# Patient Record
Sex: Male | Born: 1975 | Smoking: Never smoker
Health system: Southern US, Community
[De-identification: ages and names within clinical notes are randomized; demographics above are authoritative.]

---

## 2010-10-13 ENCOUNTER — Other Ambulatory Visit: Payer: Self-pay | Admitting: Family Medicine

## 2010-10-13 DIAGNOSIS — R1011 Right upper quadrant pain: Secondary | ICD-10-CM

## 2010-10-21 ENCOUNTER — Ambulatory Visit
Admission: RE | Admit: 2010-10-21 | Discharge: 2010-10-21 | Disposition: A | Discharge status: Home / Self Care | Payer: Managed Care, Other (non HMO) | Source: Ambulatory Visit | Attending: Family Medicine | Admitting: Family Medicine

## 2010-10-21 DIAGNOSIS — R1011 Right upper quadrant pain: Secondary | ICD-10-CM

## 2012-01-26 ENCOUNTER — Inpatient Hospital Stay: Admit: 2012-01-26 | Payer: Self-pay | Admitting: Obstetrics and Gynecology

## 2012-01-26 SURGERY — DILATION AND EVACUATION, UTERUS
Anesthesia: Choice | Site: Vagina

## 2014-11-13 ENCOUNTER — Emergency Department (HOSPITAL_COMMUNITY)
Admission: EM | Admit: 2014-11-13 | Discharge: 2014-11-13 | Disposition: A | Discharge status: Home / Self Care | Payer: Managed Care, Other (non HMO) | Attending: Emergency Medicine | Admitting: Emergency Medicine

## 2014-11-13 ENCOUNTER — Emergency Department (HOSPITAL_COMMUNITY): Payer: Managed Care, Other (non HMO)

## 2014-11-13 ENCOUNTER — Encounter (HOSPITAL_COMMUNITY): Payer: Self-pay | Admitting: Emergency Medicine

## 2014-11-13 DIAGNOSIS — S0992XA Unspecified injury of nose, initial encounter: Secondary | ICD-10-CM | POA: Insufficient documentation

## 2014-11-13 DIAGNOSIS — S0512XA Contusion of eyeball and orbital tissues, left eye, initial encounter: Secondary | ICD-10-CM | POA: Insufficient documentation

## 2014-11-13 DIAGNOSIS — S0285XA Fracture of orbit, unspecified, initial encounter for closed fracture: Secondary | ICD-10-CM

## 2014-11-13 DIAGNOSIS — Y998 Other external cause status: Secondary | ICD-10-CM | POA: Insufficient documentation

## 2014-11-13 DIAGNOSIS — Y9355 Activity, bike riding: Secondary | ICD-10-CM | POA: Insufficient documentation

## 2014-11-13 DIAGNOSIS — S0093XA Contusion of unspecified part of head, initial encounter: Secondary | ICD-10-CM | POA: Insufficient documentation

## 2014-11-13 DIAGNOSIS — Y9289 Other specified places as the place of occurrence of the external cause: Secondary | ICD-10-CM | POA: Insufficient documentation

## 2014-11-13 MED ORDER — TETRACAINE HCL 0.5 % OP SOLN
1.0000 [drp] | Freq: Once | OPHTHALMIC | Status: AC
  Administered 2014-11-13: 1 [drp] via OPHTHALMIC
  Filled 2014-11-13: qty 2

## 2014-11-13 MED ORDER — FLUORESCEIN SODIUM 1 MG OP STRP
1.0000 | ORAL_STRIP | Freq: Once | OPHTHALMIC | Status: AC
  Administered 2014-11-13: 1 via OPHTHALMIC
  Filled 2014-11-13: qty 1

## 2014-11-13 MED ORDER — HYDROCODONE-ACETAMINOPHEN 5-325 MG PO TABS: 1.0000 | ORAL_TABLET | ORAL | Status: AC | PRN

## 2014-11-13 MED ORDER — IBUPROFEN 800 MG PO TABS
800.0000 mg | ORAL_TABLET | Freq: Once | ORAL | Status: AC
  Administered 2014-11-13: 800 mg via ORAL
  Filled 2014-11-13: qty 1
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
## 2014-11-13 NOTE — Discharge Instructions (Signed)
Take the prescribed medication as directed for pain. Follow-up with Dr. Randon Goldsmith regarding orbital fracture to ensure it is healing properly and there are no changes in your vision. Return to the ED for new or worsening symptoms.

## 2014-11-13 NOTE — Progress Notes (Signed)
Patient listed as having  [employer] without a pcp.  EDCM spoke to patient and his daughter at bedside.  Patient reports his pcp is is located at the [facility].  Patient reports he has not been to see them in two years.  [facility] encouraged patient to make an appointment for follow up, discussed importance of follow up care with pcp.  Patient and patient's daughter voiced understanding.  No further EDCM needs at this time.

## 2018-09-25 ENCOUNTER — Encounter (HOSPITAL_COMMUNITY): Payer: Self-pay | Admitting: *Deleted

## 2018-09-25 ENCOUNTER — Emergency Department (HOSPITAL_COMMUNITY)
Admission: EM | Admit: 2018-09-25 | Discharge: 2018-09-25 | Disposition: A | Discharge status: Home / Self Care | Payer: Managed Care, Other (non HMO) | Attending: Emergency Medicine | Admitting: Emergency Medicine

## 2018-09-25 ENCOUNTER — Other Ambulatory Visit: Payer: Self-pay

## 2018-09-25 DIAGNOSIS — Y92018 Other place in single-family (private) house as the place of occurrence of the external cause: Secondary | ICD-10-CM | POA: Insufficient documentation

## 2018-09-25 DIAGNOSIS — Z23 Encounter for immunization: Secondary | ICD-10-CM | POA: Insufficient documentation

## 2018-09-25 DIAGNOSIS — Y9389 Activity, other specified: Secondary | ICD-10-CM | POA: Insufficient documentation

## 2018-09-25 DIAGNOSIS — S61211A Laceration without foreign body of left index finger without damage to nail, initial encounter: Secondary | ICD-10-CM

## 2018-09-25 DIAGNOSIS — Y999 Unspecified external cause status: Secondary | ICD-10-CM | POA: Insufficient documentation

## 2018-09-25 DIAGNOSIS — W268XXA Contact with other sharp object(s), not elsewhere classified, initial encounter: Secondary | ICD-10-CM | POA: Insufficient documentation

## 2018-09-25 MED ORDER — TETANUS-DIPHTH-ACELL PERTUSSIS 5-2.5-18.5 LF-MCG/0.5 IM SUSP
0.5000 mL | Freq: Once | INTRAMUSCULAR | Status: AC
  Administered 2018-09-25: 0.5 mL via INTRAMUSCULAR
  Filled 2018-09-25: qty 0.5

## 2018-09-25 NOTE — ED Triage Notes (Signed)
Pt states he was fixing his car when a steel piece cut his left index finger.  Pt states he cleaned the laceration with alcohol at home.  Bleeding controlled at this time.  Pt states his tetanus is out of date.

## 2018-09-25 NOTE — Discharge Instructions (Signed)
Please follow up with your primary care provider within 5-7 days for re-evaluation of your symptoms. If you do not have a primary care provider, information for a healthcare clinic has been provided for you to make arrangements for follow up care.  Please return to the emergency room immediately if you experience any new or worsening symptoms or any symptoms that indicate worsening infection such as fevers, increased redness/swelling/pain, warmth, or drainage from the affected area.    

## 2018-09-25 NOTE — ED Provider Notes (Signed)
[place] DEPT Provider Note   CSN: [id number] Arrival date & time: 09/25/18  1211    History   Chief Complaint Chief Complaint  Patient presents with  . Laceration   Offered translator to patient multiple times and he declined.  HPI Douglas Kaiser is a 43 y.o. male.     HPI   Patient is a 43 year old male who presents the emergency department today for evaluation of left index finger laceration.  States he was working on a car yesterday when he cut his finger on a piece of metal.  He washed out with alcohol yesterday.  Denies any other injuries.  No fevers, redness or drainage of pus.  No numbness to the finger.  No changes in range of motion.  He is not sure when his last tetanus shot was.  History reviewed. No pertinent past medical history.  There are no active problems to display for this patient.   History reviewed. No pertinent surgical history.      Home Medications    Prior to Admission medications   Medication Sig Start Date End Date Taking? Authorizing Provider  HYDROcodone-acetaminophen (NORCO/VICODIN) 5-325 MG per tablet Take 1 tablet by mouth every 4 (four) hours as needed. 11/13/14   Larene Pickett, PA-C    Family History No family history on file.  Social History Social History   Tobacco Use  . Smoking status: Never Smoker  . Smokeless tobacco: Never Used  Substance Use Topics  . Alcohol use: No  . Drug use: Never     Allergies   Patient has no known allergies.   Review of Systems Review of Systems  Constitutional: Negative for fever.  Musculoskeletal:       Finger pain  Skin: Positive for wound.     Physical Exam Updated Vital Signs BP (!) 127/93 (BP Location: Right Arm)   Pulse 69   Temp 98.5 F (36.9 C) (Oral)   Resp 16   SpO2 100%   Physical Exam Constitutional:      General: He is not in acute distress.    Appearance: He is well-developed.  Eyes:     Conjunctiva/sclera: Conjunctivae  normal.  Cardiovascular:     Rate and Rhythm: Normal rate.  Pulmonary:     Effort: Pulmonary effort is normal.  Musculoskeletal:     Comments: 1 cm superficial laceration over the dorsum of the left index finger at the level of the DIP joint.  Patient has full range of motion at the DIP, PIP and MCP joints.  No erythema warmth or significant tenderness.  No signs of infection.    Skin:    General: Skin is warm and dry.  Neurological:     Mental Status: He is alert and oriented to person, place, and time.    ED Treatments / Results  Labs (all labs ordered are listed, but only abnormal results are displayed) Labs Reviewed - No data to display  EKG None  Radiology No results found.  Procedures Procedures (including critical care time)  Medications Ordered in ED Medications  Tdap (BOOSTRIX) injection 0.5 mL (has no administration in time range)     Initial Impression / Assessment and Plan / ED Course  I have reviewed the triage vital signs and the nursing notes.  Pertinent labs & imaging results that were available during my care of the patient were reviewed by me and considered in my medical decision making (see chart for details).    Final Clinical  Impressions(s) / ED Diagnoses   Final diagnoses:  Laceration of left index finger without foreign body without damage to nail, initial encounter   43 year old male presenting for laceration to the left index finger that occurred yesterday.  Wound is superficial.  No signs of infection.  Wound is greater than 8 hours old and therefore avoided closure with sutures.  Will give patient splint and apply wound dressing to protect the wound.  Wound was copiously irrigated.  His Tdap was updated.  Advised on specific return precautions.  He voiced understanding the plan was return for all questions answered.  Patient stable discharge.  ED Discharge Orders    None       Rayne DuCouture, Deborh Pense S, PA-C 09/25/18 1336    Gerhard MunchLockwood, Robert,  MD 09/26/18 0830

## 2019-01-01 ENCOUNTER — Other Ambulatory Visit: Payer: Self-pay

## 2019-01-01 ENCOUNTER — Emergency Department (HOSPITAL_COMMUNITY): Payer: Self-pay

## 2019-01-01 ENCOUNTER — Emergency Department (HOSPITAL_COMMUNITY)
Admission: EM | Admit: 2019-01-01 | Discharge: 2019-01-01 | Disposition: A | Discharge status: Home / Self Care | Payer: Self-pay | Attending: Emergency Medicine | Admitting: Emergency Medicine

## 2019-01-01 ENCOUNTER — Encounter (HOSPITAL_COMMUNITY): Payer: Self-pay | Admitting: *Deleted

## 2019-01-01 DIAGNOSIS — M25571 Pain in right ankle and joints of right foot: Secondary | ICD-10-CM | POA: Insufficient documentation

## 2019-01-01 DIAGNOSIS — M79671 Pain in right foot: Secondary | ICD-10-CM | POA: Insufficient documentation

## 2019-01-01 MED ORDER — IBUPROFEN 600 MG PO TABS: 600.0000 mg | ORAL_TABLET | Freq: Four times a day (QID) | ORAL | 0 refills | Status: AC | PRN

## 2019-01-01 MED ORDER — IBUPROFEN 200 MG PO TABS
600.0000 mg | ORAL_TABLET | Freq: Once | ORAL | Status: AC
  Administered 2019-01-01: 12:00:00 600 mg via ORAL
  Filled 2019-01-01: qty 3

## 2019-01-01 NOTE — ED Triage Notes (Signed)
Pt states he jump off a table this am hurting his rt knee, leg and foot, due to language barrier, pain is worse in ankle area.      [facility] used  # [id number] [employer]

## 2019-01-01 NOTE — Discharge Instructions (Signed)
X-rays look good.  Use brace and crutches, ice and elevate the foot and ankle as much as possible.  Use ibuprofen 600 mg every 6 hours, Tylenol as needed for additional pain.  If symptoms or not improving in 1 week please follow-up with sports medicine.

## 2019-01-01 NOTE — ED Provider Notes (Signed)
[facility] DEPT Provider Note   CSN: [id number] Arrival date & time: 01/01/19  0945     History   Chief Complaint Chief Complaint  Patient presents with  . Foot Pain    rt  . Ankle Pain    rt    HPI Douglas Kaiser is a 43 y.o. male.     Douglas Kaiser is a 43 y.o. male otherwise healthy, presents to the ED complaining of pain in his right foot and leg after he jumped off of the table, and landed on his arm handle with his right foot.  He reports pain over the inner arch of the right foot, right heel and right medial ankle.  He reports he has had difficulty walking due to pain.  Reports occasionally he gets mild pain in his knee but this is minimal compared to the pain in his foot and ankle.  He did not hear any pop and does not have an obvious deformity to the foot or ankle.  He denies any bruising discoloration, no bleeding or wounds.  No previous history or injury.  No medications prior to arrival.  No other aggravating or alleviating factors.  Patient is Arabic speaking, interpreter used to obtain history.     History reviewed. No pertinent past medical history.  There are no active problems to display for this patient.   History reviewed. No pertinent surgical history.      Home Medications    Prior to Admission medications   Medication Sig Start Date End Date Taking? Authorizing Provider  HYDROcodone-acetaminophen (NORCO/VICODIN) 5-325 MG per tablet Take 1 tablet by mouth every 4 (four) hours as needed. 11/13/14   Larene Pickett, PA-C  ibuprofen (ADVIL) 600 MG tablet Take 1 tablet (600 mg total) by mouth every 6 (six) hours as needed. 01/01/19   Jacqlyn Larsen, PA-C    Family History No family history on file.  Social History Social History   Tobacco Use  . Smoking status: Never Smoker  . Smokeless tobacco: Never Used  Substance Use Topics  . Alcohol use: No  . Drug use: Never     Allergies   Patient has no known  allergies.   Review of Systems Review of Systems  Constitutional: Negative for chills and fever.  Musculoskeletal: Positive for arthralgias. Negative for joint swelling and myalgias.  Skin: Negative for color change and wound.  Neurological: Negative for weakness and numbness.     Physical Exam Updated Vital Signs BP 124/79   Pulse 74   Temp 99.2 F (37.3 C)   Resp 16   Ht 5\' 9"  (1.753 m)   Wt 90.7 kg   SpO2 99%   BMI 29.53 kg/m   Physical Exam Vitals signs and nursing note reviewed.  Constitutional:      General: He is not in acute distress.    Appearance: Normal appearance. He is well-developed and normal weight. He is not ill-appearing or diaphoretic.  HENT:     Head: Normocephalic and atraumatic.  Eyes:     General:        Right eye: No discharge.        Left eye: No discharge.  Pulmonary:     Effort: Pulmonary effort is normal. No respiratory distress.  Musculoskeletal:     Comments: There is mild tenderness to palpation over the medial right ankle MCL without palpable deformity or swelling, no ecchymosis, there is no tenderness or deformity over the ankle, no tenderness over the  shin and ankle, full range of motion of the knee ankle.  2+ DP and PT pulses, normal sensation and strength.  Skin:    General: Skin is warm and dry.  Neurological:     Mental Status: He is alert and oriented to person, place, and time.     Coordination: Coordination normal.  Psychiatric:        Mood and Affect: Mood normal.        Behavior: Behavior normal.      ED Treatments / Results  Labs (all labs ordered are listed, but only abnormal results are displayed) Labs Reviewed - No data to display  EKG None  Radiology Dg Ankle Complete Right  Result Date: 01/01/2019 CLINICAL DATA:  Patient jumped off the table and landed on the foot wrong. Pain. EXAM: RIGHT ANKLE - COMPLETE 3+ VIEW COMPARISON:  None. FINDINGS: There is no evidence of fracture, dislocation, or joint  effusion. There is no evidence of arthropathy or other focal bone abnormality. Soft tissues are unremarkable. IMPRESSION: No acute osseous injury of the right ankle. Electronically Signed   By: Elige Ko   On: 01/01/2019 11:01   Dg Foot Complete Right  Result Date: 01/01/2019 CLINICAL DATA:  Pt jumped from table this a.m and landed wrong on rt foot and ankle, generalized rt foot and ankle pain EXAM: RIGHT FOOT COMPLETE - 3+ VIEW COMPARISON:  None. FINDINGS: There is no evidence of fracture or dislocation. There is no evidence of arthropathy or other focal bone abnormality. Soft tissues are unremarkable. IMPRESSION: No acute osseous injury of the right foot. Electronically Signed   By: Elige Ko   On: 01/01/2019 11:02    Procedures Procedures (including critical care time)  Medications Ordered in ED Medications  ibuprofen (ADVIL) tablet 600 mg (600 mg Oral Given 01/01/19 1132)     Initial Impression / Assessment and Plan / ED Course  I have reviewed the triage vital signs and the nursing notes.  Pertinent labs & imaging results that were available during my care of the patient were reviewed by me and considered in my medical decision making (see chart for details).  43 year old male presents with injury to the right foot and ankle after he jumped down from a table and landed on a broom.  The foot is neurovascularly intact with no obvious deformity or skin changes.  X-rays are unremarkable and show no fracture or acute abnormality.  The foot is neurovascularly intact.  Suspect mild ankle sprain versus soft tissue injury.  Will place patient in an ASO brace and provide crutches.  NSAIDs, Tylenol, ice and elevation for pain.  Sports medicine follow-up in 1 week if not improving.  Return precautions discussed.  Discharged home in good condition.  Final Clinical Impressions(s) / ED Diagnoses   Final diagnoses:  Right foot pain  Acute right ankle pain    ED Discharge Orders          Ordered    ibuprofen (ADVIL) 600 MG tablet  Every 6 hours PRN     01/01/19 1126           Dartha Lodge, [place] 01/01/19 1137    Derwood Kaplan, MD 01/01/19 1610

## 2019-09-13 ENCOUNTER — Other Ambulatory Visit: Payer: Self-pay

## 2019-09-13 ENCOUNTER — Encounter (HOSPITAL_COMMUNITY): Payer: Self-pay

## 2019-09-13 ENCOUNTER — Emergency Department (HOSPITAL_COMMUNITY): Payer: 59

## 2019-09-13 DIAGNOSIS — Y9389 Activity, other specified: Secondary | ICD-10-CM | POA: Diagnosis not present

## 2019-09-13 DIAGNOSIS — S61412A Laceration without foreign body of left hand, initial encounter: Secondary | ICD-10-CM | POA: Insufficient documentation

## 2019-09-13 DIAGNOSIS — Z79899 Other long term (current) drug therapy: Secondary | ICD-10-CM | POA: Diagnosis not present

## 2019-09-13 DIAGNOSIS — Y999 Unspecified external cause status: Secondary | ICD-10-CM | POA: Diagnosis not present

## 2019-09-13 DIAGNOSIS — Y9289 Other specified places as the place of occurrence of the external cause: Secondary | ICD-10-CM | POA: Insufficient documentation

## 2019-09-13 DIAGNOSIS — W260XXA Contact with knife, initial encounter: Secondary | ICD-10-CM | POA: Insufficient documentation

## 2019-09-13 DIAGNOSIS — M7918 Myalgia, other site: Secondary | ICD-10-CM | POA: Insufficient documentation
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
## 2019-09-14 ENCOUNTER — Emergency Department (HOSPITAL_COMMUNITY)
Admission: EM | Admit: 2019-09-14 | Discharge: 2019-09-14 | Disposition: A | Discharge status: Home / Self Care | Payer: 59 | Attending: Emergency Medicine | Admitting: Emergency Medicine

## 2019-09-14 DIAGNOSIS — S61412A Laceration without foreign body of left hand, initial encounter: Secondary | ICD-10-CM

## 2019-09-14 NOTE — ED Provider Notes (Signed)
[facility] DEPT Provider Note   CSN: [id number] Arrival date & time: 09/13/19  [name]     History Chief Complaint  Patient presents with  . Extremity Laceration    Douglas Kaiser is a 44 y.o. male without significant past medical history who presents to the emergency department with complaints of left hand laceration which occurred shortly prior to arrival.  Patient states that he accidentally cut the hand with a knife, there was fairly minimal bleeding, he is having some discomfort to this area, no alleviating or aggravating factors.  He had a tetanus shot within the past 5 years.  He denies numbness, tingling, or weakness.  Denies other areas of injury.  He is left-hand dominant.    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  HPI     History reviewed. No pertinent past medical history.  There are no problems to display for this patient.   History reviewed. No pertinent surgical history.     No family history on file.  Social History   Tobacco Use  . Smoking status: Never Smoker  . Smokeless tobacco: Never Used  Vaping Use  . Vaping Use: Never used  Substance Use Topics  . Alcohol use: No  . Drug use: Never    Home Medications Prior to Admission medications   Medication Sig Start Date End Date Taking? Authorizing Provider  HYDROcodone-acetaminophen (NORCO/VICODIN) 5-325 MG per tablet Take 1 tablet by mouth every 4 (four) hours as needed. 11/13/14   Larene Pickett, PA-C  ibuprofen (ADVIL) 600 MG tablet Take 1 tablet (600 mg total) by mouth every 6 (six) hours as needed. 01/01/19   Jacqlyn Larsen, PA-C    Allergies    Patient has no known allergies.  Review of Systems   Review of Systems  Constitutional: Negative for chills and fever.  Respiratory: Negative for shortness of breath.   Cardiovascular: Negative for chest pain.  Musculoskeletal: Positive for myalgias.  Skin: Positive for wound.  Neurological: Negative for  weakness and numbness.    Physical Exam Updated Vital Signs BP 123/71 (BP Location: Left Arm)   Pulse 75   Temp 98.2 F (36.8 C) (Oral)   Resp (!) 24   Ht 5\' 6"  (1.676 m)   Wt 90.7 kg   SpO2 98%   BMI 32.28 kg/m   Physical Exam Vitals and nursing note reviewed.  Constitutional:      General: He is not in acute distress.    Appearance: Normal appearance. He is not ill-appearing or toxic-appearing.  HENT:     Head: Normocephalic and atraumatic.  Neck:     Comments: No midline tenderness.  Cardiovascular:     Rate and Rhythm: Normal rate.     Pulses:          Radial pulses are 2+ on the right side and 2+ on the left side.  Pulmonary:     Effort: No respiratory distress.     Breath sounds: Normal breath sounds.  Musculoskeletal:     Cervical back: Normal range of motion and neck supple.     Comments: Upper extremities: Patient has a 2.5 cm linear extremely superficial laceration to the dorsal aspect of the left hand, approximately 74mm depth.  There is no subcutaneous tissue exposed.  He is mildly tender over the wound.  There is no active bleeding or visible foreign bodies.  No erythema, warmth, drainage, or ecchymosis.  Patient has intact active range of motion throughout the upper extremities, he is  able to flex/extend the wrist and all digits against resistance in the left upper extremity.  Skin:    General: Skin is warm and dry.     Capillary Refill: Capillary refill takes less than 2 seconds.  Neurological:     Mental Status: He is alert.     Comments: Alert. Clear speech. Sensation grossly intact to bilateral upper extremities. 5/5 symmetric grip strength. Ambulatory.  Able to perform okay sign, thumbs up, and cross second/third digits bilaterally.  Psychiatric:        Mood and Affect: Mood normal.        Behavior: Behavior normal.     ED Results / Procedures / Treatments   Labs (all labs ordered are listed, but only abnormal results are displayed) Labs Reviewed -  No data to display  EKG None  Radiology DG Hand Complete Left  Result Date: 09/13/2019 CLINICAL DATA:  44 year old male with laceration of the left hand. EXAM: LEFT HAND - COMPLETE 3+ VIEW COMPARISON:  None. FINDINGS: There is no acute fracture or dislocation. The bones are well mineralized. No significant arthritic changes. The soft tissues are unremarkable. IMPRESSION: Negative. Electronically Signed   By: Elgie Collard M.D.   On: 09/13/2019 19:55    Procedures .Marland KitchenLaceration Repair  Date/Time: 09/14/2019 1:00 AM Performed by: Cherly Anderson, PA-C Authorized by: Cherly Anderson, PA-C      LACERATION REPAIR Performed by: Harvie Heck Authorized by: Harvie Heck Consent: Verbal consent obtained. Risks and benefits: risks, benefits and alternatives were discussed Consent given by: patient Patient identity confirmed: provided demographic data  Laceration Location: dorsal left hand Laceration Length: 2.5 cm No Foreign Bodies seen or palpated Anesthesia: None Irrigation method: syringe Amount of cleaning: standard  Skin closure: 3 steri strips placed with nonadherent bandage & gauze.   Patient tolerance: Patient tolerated the procedure well with no immediate complications.  (including critical care time)  Medications Ordered in ED Medications - No data to display  ED Course  I have reviewed the triage vital signs and the nursing notes.  Pertinent labs & imaging results that were available during my care of the patient were reviewed by me and considered in my medical decision making (see chart for details).    MDM Rules/Calculators/A&P                         Patient presents to the emergency department status post injury to the dorsal left hand.  He has a very superficial linear laceration present which was cleansed and irrigated, given superficiality it does not appear to require closure with sutures, staples or skin adhesive, Steri-Strips  were applied. His tetanus is up to date.  An x-ray was obtained per triage protocol which I have personally reviewed and interpreted, no acute process, no underlying fracture or dislocation.  Patient is neurovascularly intact distally.  No signs of tendon injury.  He overall appears appropriate for discharge home at this time.  We discussed wound care. I discussed results, treatment plan, need for follow-up, and return precautions with the patient. Provided opportunity for questions, patient confirmed understanding and is in agreement with plan.   Final Clinical Impression(s) / ED Diagnoses Final diagnoses:  Laceration of left hand without foreign body, initial encounter    Rx / DC Orders ED Discharge Orders    None       Cherly Anderson, PA-C 09/14/19 0104    Nira Conn, MD 09/14/19 0330

## 2019-09-14 NOTE — Discharge Instructions (Addendum)
You were seen in the emergency department today for an injury to your left hand.  Your x-ray did not show a fracture or dislocation.  Your wound was cleansed and closed with Steri-Strips, the strips will come off on their own.  Please do not get the area wet for the first 24 hours, after 24 hours it can get wet but do not soak it.  Please follow wound care instructions.  Please follow-up with your primary care provider in 1 week for recheck of the wound, return to the ER for new or worsening symptoms including but not limited to bleeding from the wound, redness/drainage from and around the wound, fever, numbness, weakness, or any other concerns.  Google translate:  ??? ??????? ?? ??? ??????? ????? ?????? ?? ??? ??????. ?? ???? ?????? ??????? ?????? ?? ?? ??? ?? ???. ?? ????? ????? ??????? ???????? ????? Steri-Strips ? ?????? ??????? ?? ????? ?????. ?? ???? ?? ???? ??????? ???? ??? 24 ???? ?????? ? ??? 24 ???? ???? ?? ???? ???? ?? ??????. ???? ????? ??????? ??????? ???????. ???? ???????? ?? ???? ??????? ??????? ????? ?? ?? ???? ????? ???? ?????? ??? ????? ? ??????? ??? ???? ??????? ?????? ??????? ??????? ?? ????????? ??? ?? ??? ??? ???? ?????? ?? ????? ? ?????? ?? ????? ? ????????? / ??????? ?? ????? ????? ? ?????? ? ?????? ? ????? ? ?? ?? ????? ????. tamat mushahadatuk fi qism altawari alyawm li'iisabat fi yadik alyusraa. lam tuzhar al'ashieat alsiyniat alkhasat bik 'aya kasr 'aw khalea. tama tathir aljurh wa'iighlaquh biastikhdam sharayih Steri-Strips , wasatazul alsharayit min tilqa' nafsiha. min fadlik la tubalil almintaqat khilal al 24 saeat al'uwlaa , baed 24 saeat yumkin 'an tubalil walakin la tanqaeuha. yurjaa aitibae taelimat aleinayat bialjuruhi. yurjaa almutabaeat mae muqadam alrieayat al'awaliat alkhasi bik fi ghudun 'usbue wahid li'iieadat fahs aljurh , waleawdat 'iilaa ghurfat altawari limaerifat al'aerad aljadidat 'aw almutafaqimat bima fi dhalik ealaa sabil almithal la alhasr , alnazif min aljurh  , walaihmirar / altasrif min Sandi Mariscal , walkhadar , aldaef , 'aw 'ayi makhawif 'ukhraa.

## 2020-07-19 IMAGING — CR DG FOOT COMPLETE 3+V*R*
3 series · 3 of 3 positions shown · non-contrast
Comparison: None.

CLINICAL DATA: Pt jumped from table this a.m and landed wrong on rt
foot and ankle, generalized rt foot and ankle pain

EXAM:
RIGHT FOOT COMPLETE - 3+ VIEW

[x foot ap right]
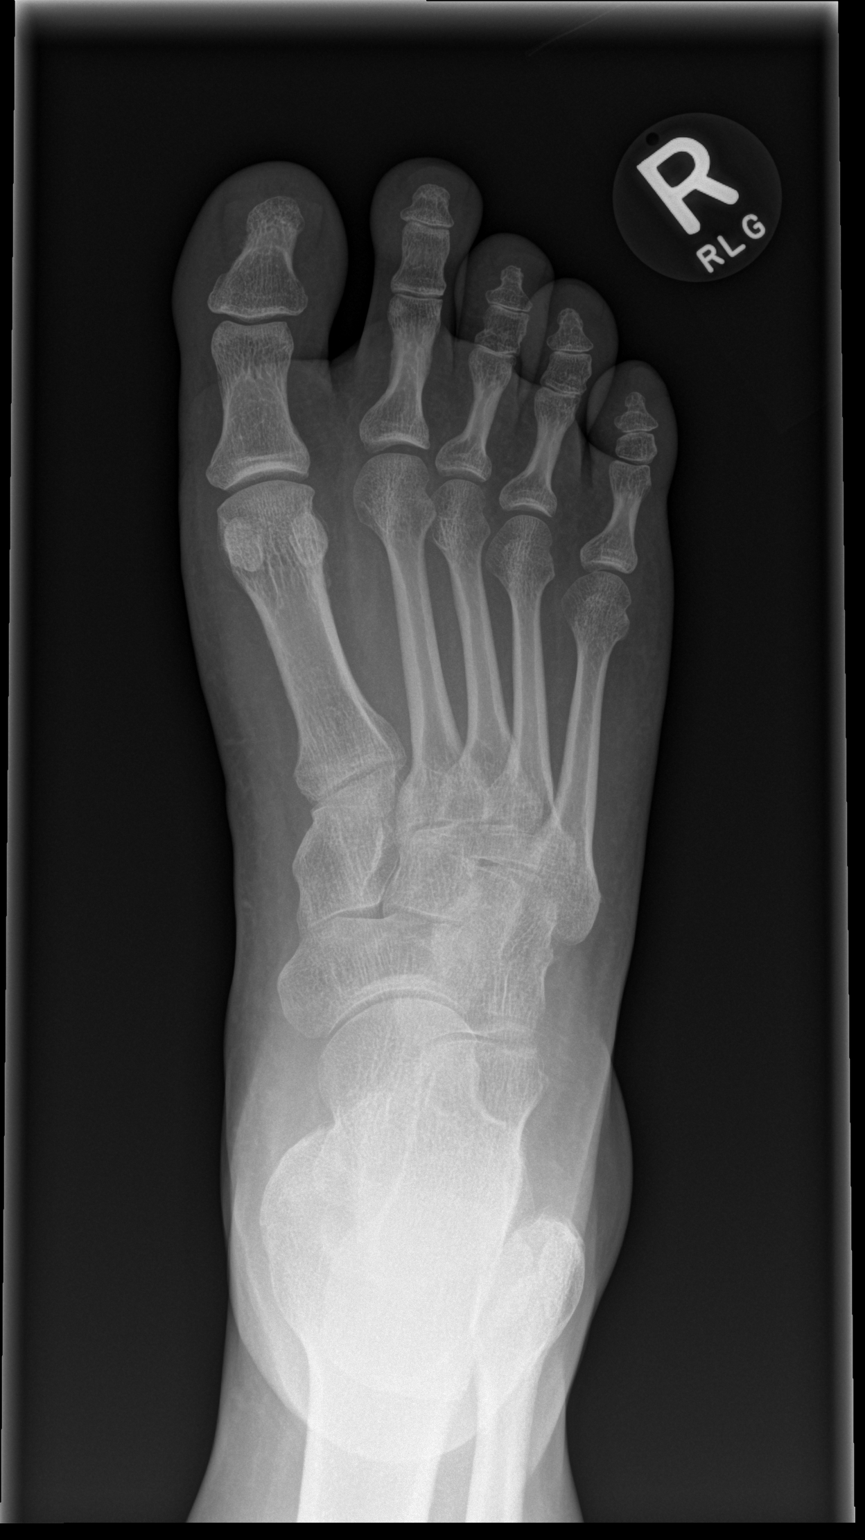

[x foot obl right]
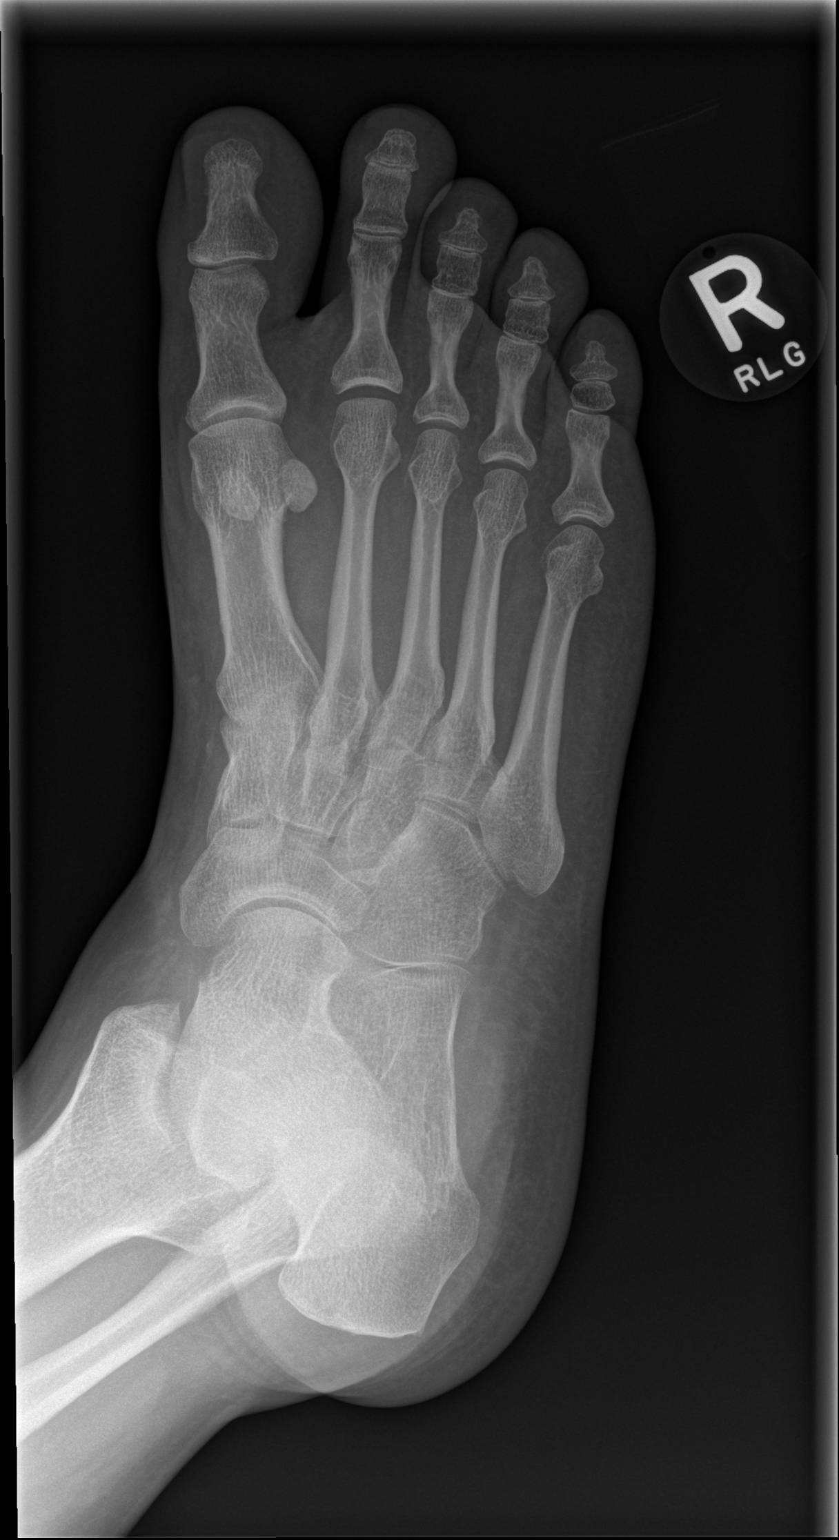

[x foot lat right]
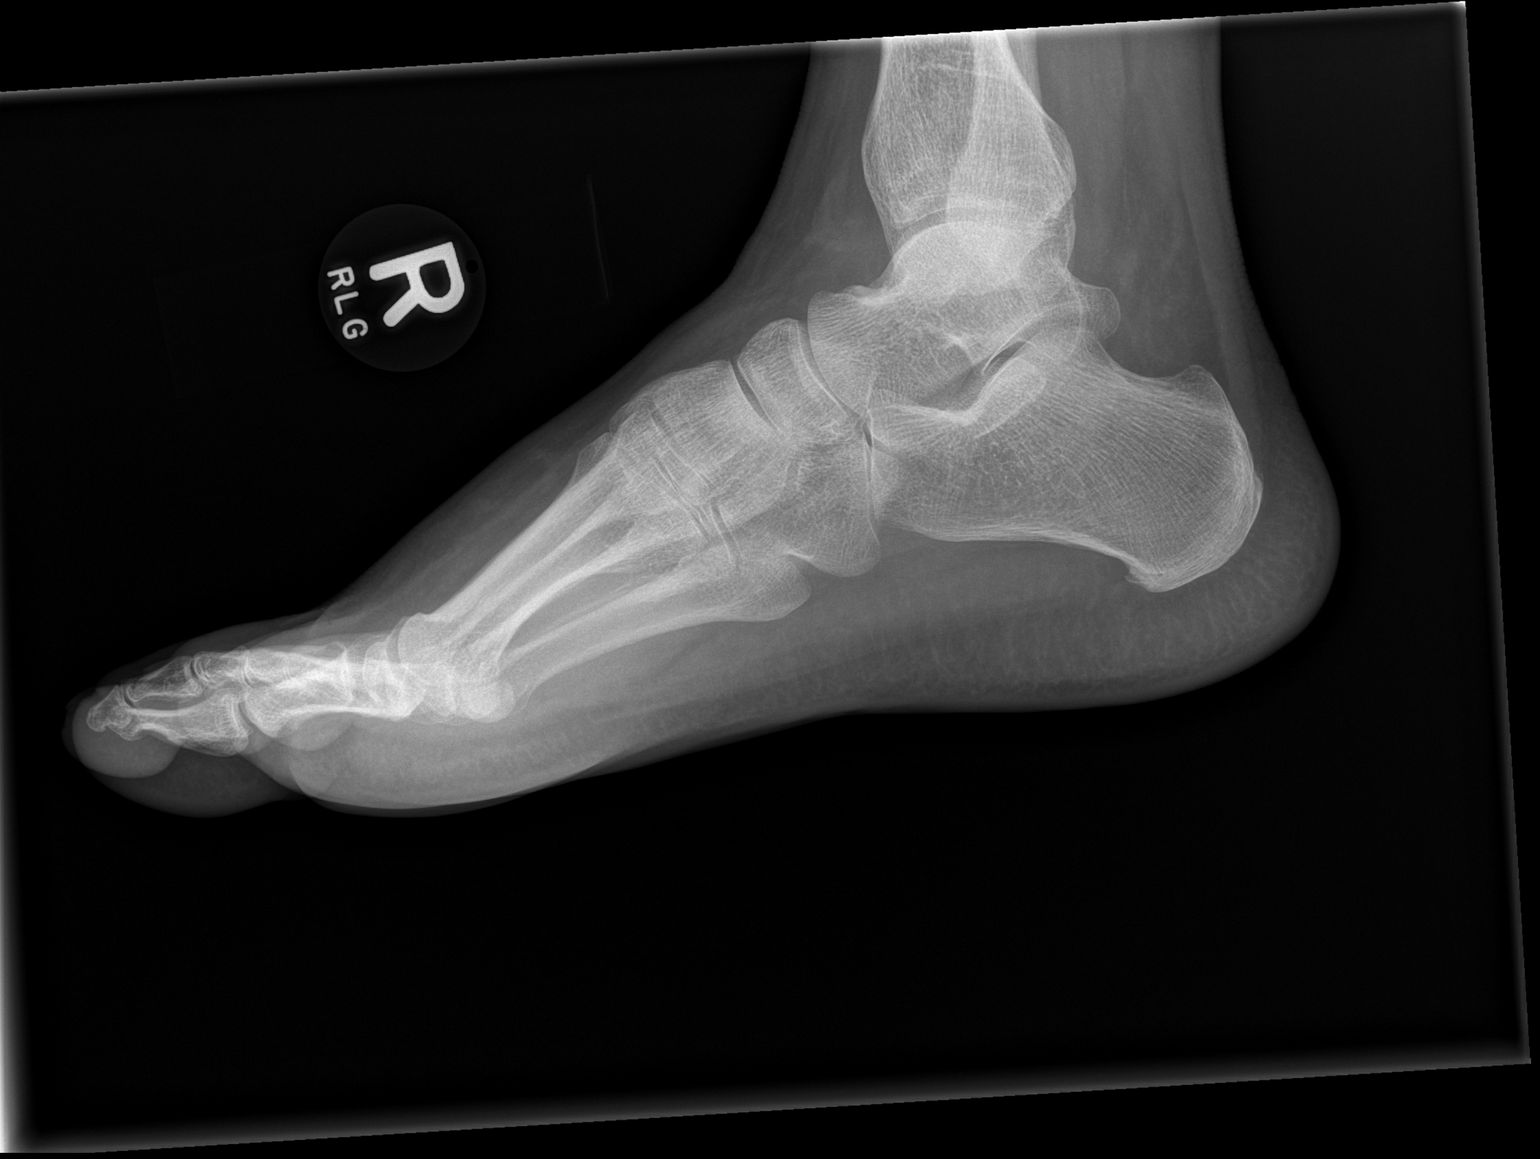

[3 of 3 positions shown; findings below may reference images not displayed]

FINDINGS: There is no evidence of fracture or dislocation. There is no
evidence of arthropathy or other focal bone abnormality. Soft
tissues are unremarkable.
IMPRESSION: No acute osseous injury of the right foot.

## 2021-03-31 IMAGING — CR DG HAND COMPLETE 3+V*L*
3 series · 3 of 3 positions shown · non-contrast
Comparison: None.

CLINICAL DATA: 44-year-old male with laceration of the left hand.

EXAM:
LEFT HAND - COMPLETE 3+ VIEW

[x hand obl left (1 of 2)]
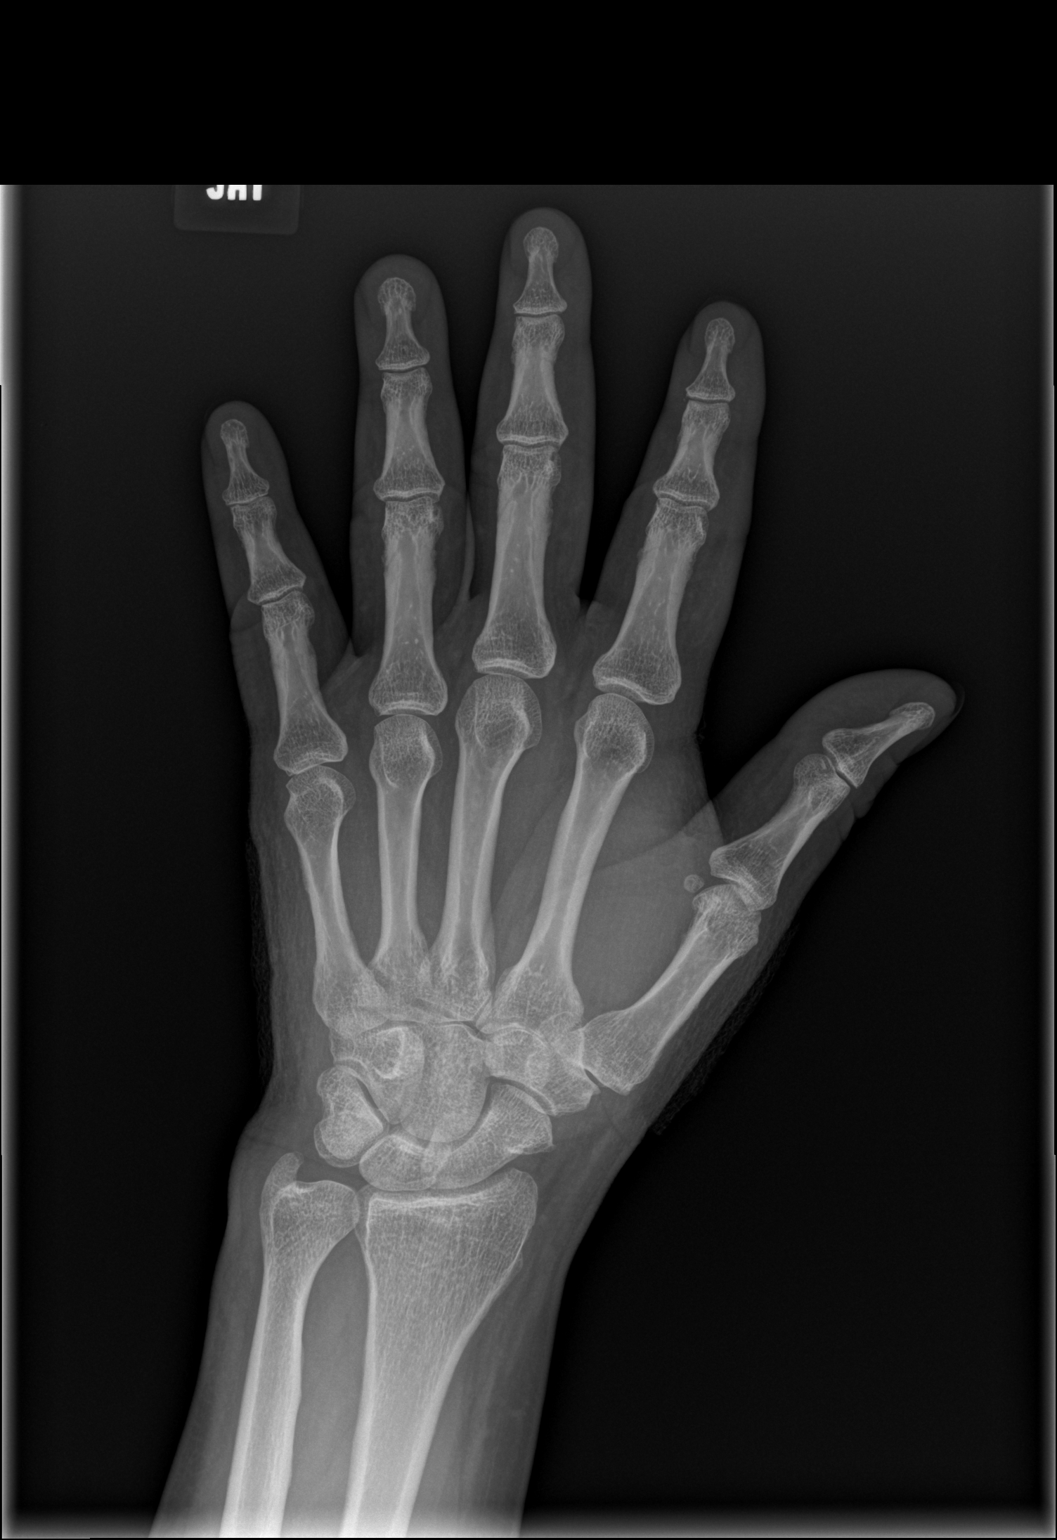

[x hand obl left (2 of 2)]
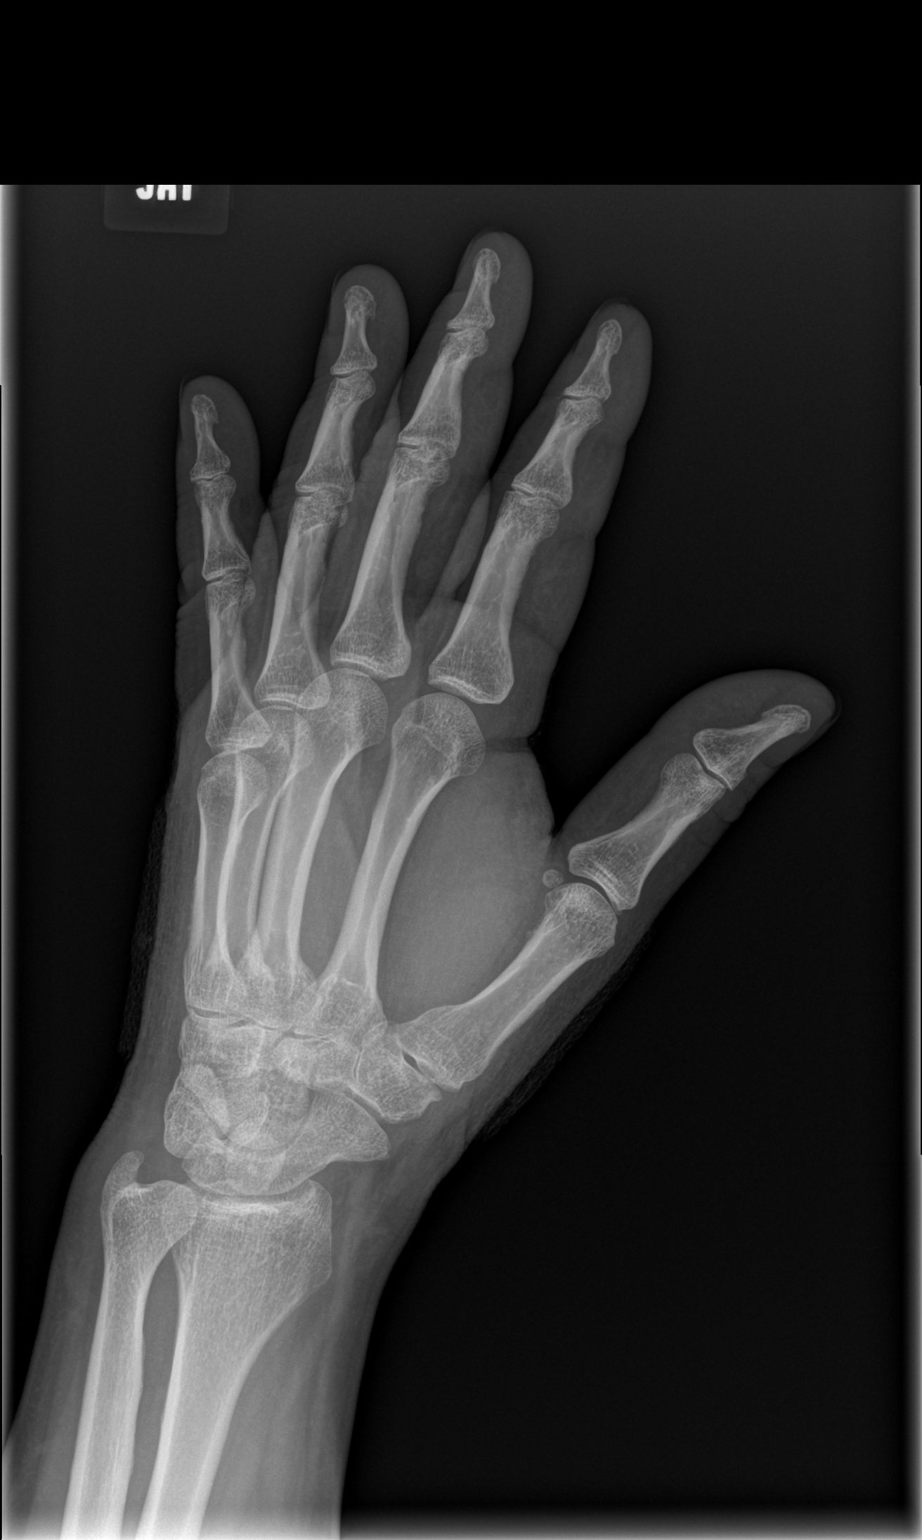

[x hand lat left]
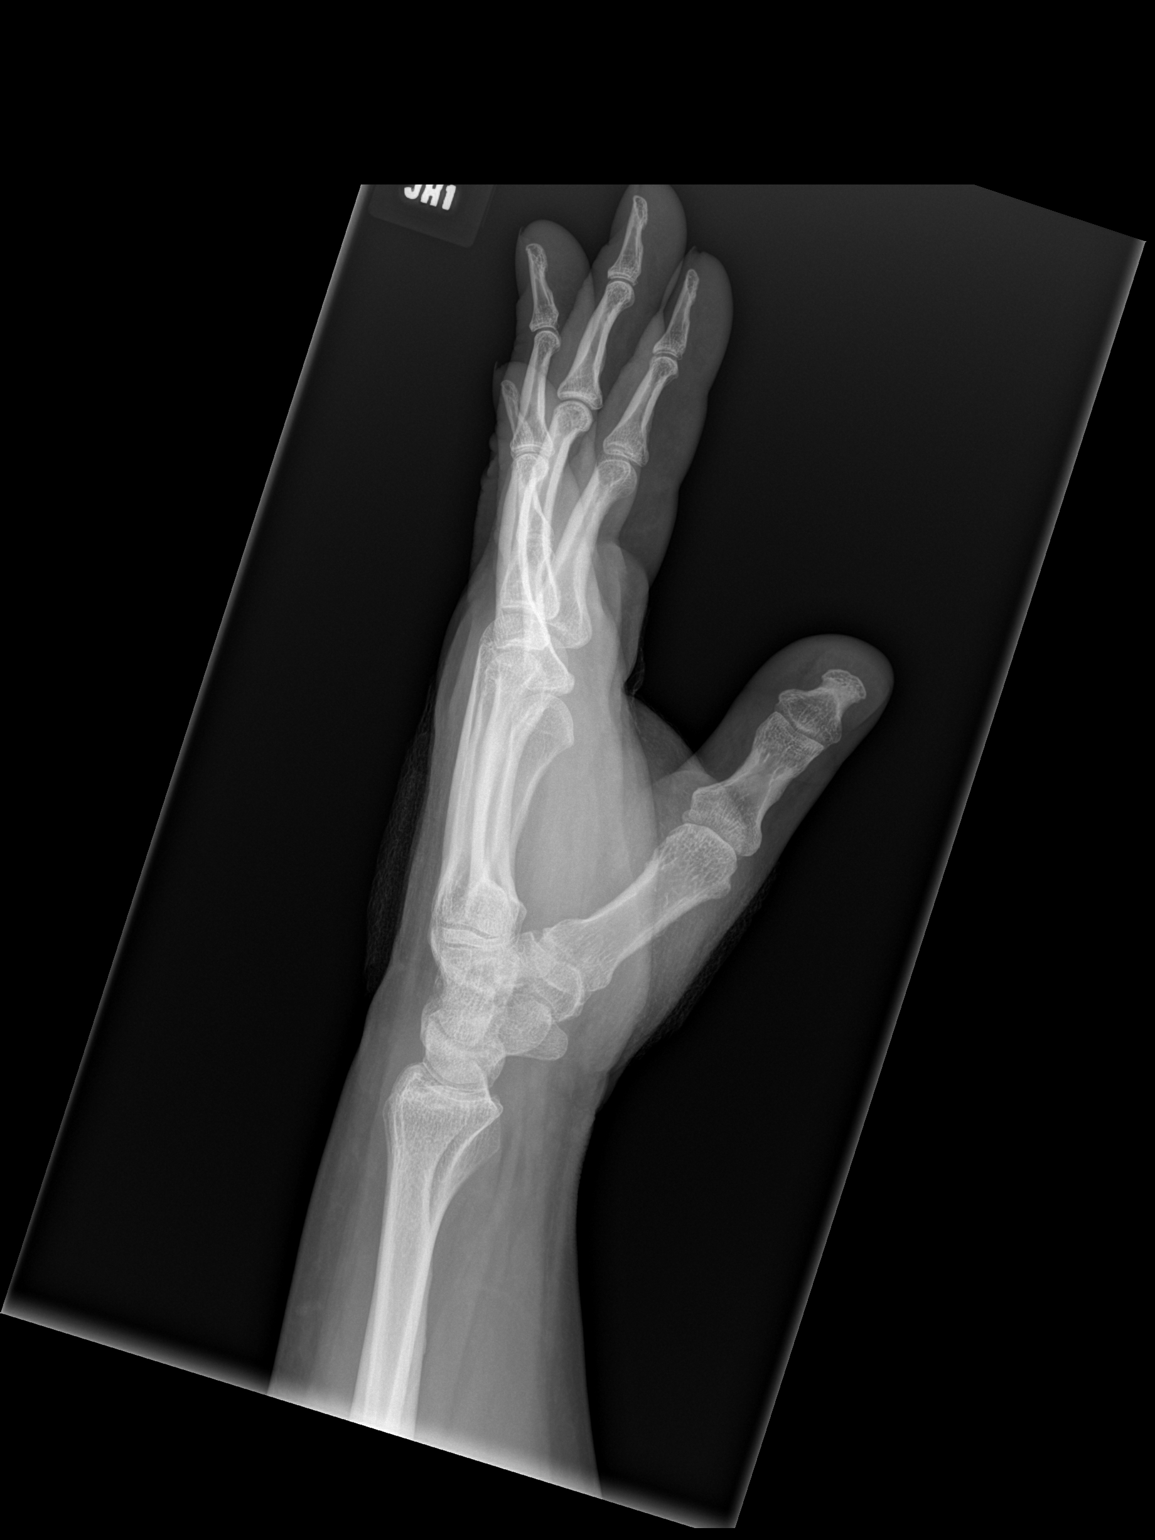

[3 of 3 positions shown; findings below may reference images not displayed]

FINDINGS: There is no acute fracture or dislocation. The bones are well
mineralized. No significant arthritic changes. The soft tissues are
unremarkable.
IMPRESSION: Negative.
